# Patient Record
Sex: Male | Born: 2000 | Race: White | Hispanic: No | Marital: Single | Smoking: Never smoker
Health system: Southern US, Community
[De-identification: ages and names within clinical notes are randomized; demographics above are authoritative.]

---
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
## 2022-04-06 ENCOUNTER — Ambulatory Visit
Admission: RE | Admit: 2022-04-06 | Discharge: 2022-04-06 | Disposition: A | Discharge status: Home / Self Care | Payer: No Typology Code available for payment source | Source: Ambulatory Visit | Attending: Physician Assistant | Admitting: Physician Assistant

## 2022-04-06 ENCOUNTER — Other Ambulatory Visit: Payer: Self-pay | Admitting: Physician Assistant

## 2022-04-06 DIAGNOSIS — Y990 Civilian activity done for income or pay: Secondary | ICD-10-CM | POA: Insufficient documentation

## 2022-04-06 DIAGNOSIS — X58XXXA Exposure to other specified factors, initial encounter: Secondary | ICD-10-CM | POA: Insufficient documentation

## 2022-04-06 DIAGNOSIS — S6991XA Unspecified injury of right wrist, hand and finger(s), initial encounter: Secondary | ICD-10-CM | POA: Insufficient documentation

## 2022-04-15 ENCOUNTER — Other Ambulatory Visit: Payer: Self-pay

## 2022-04-15 ENCOUNTER — Encounter: Payer: Self-pay | Admitting: Emergency Medicine

## 2022-04-15 ENCOUNTER — Emergency Department
Admission: EM | Admit: 2022-04-15 | Discharge: 2022-04-15 | Disposition: A | Discharge status: Home / Self Care | Payer: Medicaid Other | Attending: Emergency Medicine | Admitting: Emergency Medicine

## 2022-04-15 DIAGNOSIS — S62646A Nondisplaced fracture of proximal phalanx of right little finger, initial encounter for closed fracture: Secondary | ICD-10-CM | POA: Diagnosis not present

## 2022-04-15 DIAGNOSIS — S62606A Fracture of unspecified phalanx of right little finger, initial encounter for closed fracture: Secondary | ICD-10-CM

## 2022-04-15 DIAGNOSIS — W231XXA Caught, crushed, jammed, or pinched between stationary objects, initial encounter: Secondary | ICD-10-CM | POA: Insufficient documentation

## 2022-04-15 DIAGNOSIS — S6991XA Unspecified injury of right wrist, hand and finger(s), initial encounter: Secondary | ICD-10-CM | POA: Diagnosis present

## 2022-04-15 NOTE — ED Triage Notes (Signed)
Pt here for a consult to orthopedics due to a finger injury that already has x-rays. Pt states finger got smashed Pt unable to go to PCP for referral as he does not currently have one.

## 2022-04-15 NOTE — ED Provider Notes (Signed)
[facility] Provider Note    Event Date/Time   First MD Initiated Contact with Patient 04/15/22 1159     (approximate)   History   Finger Injury   HPI  Lee Myers is a 22 y.o. male with history of fracture finger to the right little finger.  Patient has x-ray report and his mother states that they provided this at his father's workplace.  When they have decided not to do [employer] they no longer had a referral to orthopedics.  States that he needs to see a orthopedist for the finger fracture.  Did not finish the antibiotics he was given.  States he only took 2 days.  Tdap is up-to-date      Physical Exam   Triage Vital Signs: ED Triage Vitals  Enc Vitals Group     BP 04/15/22 1138 113/66     Pulse Rate 04/15/22 1138 85     Resp 04/15/22 1138 15     Temp 04/15/22 1138 97.7 F (36.5 C)     Temp src --      SpO2 04/15/22 1138 96 %     Weight 04/15/22 1139 120 lb (54.4 kg)     Height 04/15/22 1139 5\' 8"  (1.727 m)     Head Circumference --      Peak Flow --      Pain Score 04/15/22 1138 5     Pain Loc --      Pain Edu? --      Excl. in [place]? --     Most recent vital signs: Vitals:   04/15/22 1138  BP: 113/66  Pulse: 85  Resp: 15  Temp: 97.7 F (36.5 C)  SpO2: 96%     General: Awake, no distress.   CV:  Good peripheral perfusion. regular rate and  rhythm Resp:  Normal effort.  Abd:  No distention.   Other:  Right small finger abrasion/laceration noted distally, areas minimally tender, he does have good range of motion, neurovascular is intact   ED Results / Procedures / Treatments   Labs (all labs ordered are listed, but only abnormal results are displayed) Labs Reviewed - No data to display   EKG     RADIOLOGY     PROCEDURES:   Procedures   MEDICATIONS ORDERED IN ED: Medications - No data to display   IMPRESSION / MDM / [place] / ED COURSE  I reviewed the triage vital signs and the  nursing notes.                              Differential diagnosis includes, but is not limited to, fracture, contusion, cellulitis  Patient's presentation is most consistent with acute, uncomplicated illness.   The patient is basically here for referral to orthopedics.  He is to finish his antibiotics as previously prescribed.  Patient is to buddy tape the fingers.  Follow-up with Dr. Peggye Ley at [facility].  Return emergency department worsening.  They are in agreement treatment plan.  He was discharged stable condition.      FINAL CLINICAL IMPRESSION(S) / ED DIAGNOSES   Final diagnoses:  Closed nondisplaced fracture of phalanx of right little finger, unspecified phalanx, initial encounter     Rx / DC Orders   ED Discharge Orders     None        Note:  This document was prepared using Dragon voice recognition  software and may include unintentional dictation errors.    Versie Starks, PA-C 04/15/22 1514    Carrie Mew, MD 04/16/22 [phone]

## 2022-05-04 ENCOUNTER — Other Ambulatory Visit: Payer: Self-pay

## 2022-05-04 ENCOUNTER — Ambulatory Visit
Admission: EM | Admit: 2022-05-04 | Discharge: 2022-05-04 | Disposition: A | Discharge status: Home / Self Care | Payer: Medicaid Other | Attending: Family Medicine | Admitting: Family Medicine

## 2022-05-04 DIAGNOSIS — R300 Dysuria: Secondary | ICD-10-CM

## 2022-05-04 DIAGNOSIS — N3001 Acute cystitis with hematuria: Secondary | ICD-10-CM | POA: Diagnosis present

## 2022-05-04 LAB — URINALYSIS, W/ REFLEX TO CULTURE (INFECTION SUSPECTED)
Glucose, UA: NEGATIVE mg/dL
Ketones, ur: NEGATIVE mg/dL
Nitrite: NEGATIVE
Protein, ur: 30 mg/dL — AB
RBC / HPF: >50 RBC/hpf (ref 0–5)
Specific Gravity, Urine: >1.03 — ABNORMAL HIGH (ref 1.005–1.030)
pH: 5.5 (ref 5.0–8.0)

## 2022-05-04 MED ORDER — CEPHALEXIN 500 MG PO CAPS: 500.0000 mg | ORAL_CAPSULE | Freq: Four times a day (QID) | ORAL | 0 refills | Status: AC

## 2022-05-04 NOTE — Discharge Instructions (Addendum)
Stop by your pharmacy to pick up your antibiotics.  If someone contact you to take to stop taking your antibiotics you do not have a urinary tract infection and it is more likely that you have a kidney stone.  If you are still having pain or your pain continues to worsen please go to the emergency department for a scan of your belly to look for stones in your kidneys or your urinary tract.  Take 400 to 600 mg of ibuprofen every 6 hours as needed for pain.  Be sure to drink plenty of fluids.

## 2022-05-04 NOTE — ED Provider Notes (Signed)
[facility]    CSN: [id number] Arrival date & time: 05/04/22  [id number]      History   Chief Complaint Chief Complaint  Patient presents with   Dysuria    HPI  HPI  Lee Myers is a 22 y.o. male.   He had buring when finish urinating for the past 2 days. Last night, he saw clots in his urine. No history of kidney stones or urinary tract infections. He saw some mucus with the 2 clots.   Denies known STI exposure. Lee Myers reports he has not had intercourse in the past 1-1.5 years.    - Penile discharge no -Testicular pain no  - Fever: no - Abdominal pain : yes lower  - Rash: no - Sore throat: no   - Arthralgias: no - Nausea: no - Vomiting: no - Dysuria: yes  - Hematuria: yes  - Back Pain: yes  - Headache: no       History reviewed. No pertinent past medical history.  There are no problems to display for this patient.   History reviewed. No pertinent surgical history.     Home Medications    Prior to Admission medications   Medication Sig Start Date End Date Taking? Authorizing Provider  cephALEXin (KEFLEX) 500 MG capsule Take 1 capsule (500 mg total) by mouth 4 (four) times daily for 10 days. 05/04/22 05/14/22 Yes Lee Schnapp, DO  lidocaine (XYLOCAINE) 2 % solution Use as directed 15 mLs in the mouth or throat every 6 (six) hours as needed for mouth pain. 10/24/17   Loney Hering, MD    Family History History reviewed. No pertinent family history.  Social History Social History   Tobacco Use   Smoking status: Never    Passive exposure: Yes   Smokeless tobacco: Never  Vaping Use   Vaping Use: Every day  Substance Use Topics   Alcohol use: Never   Drug use: Yes    Types: Marijuana    Comment: last use 2 months     Allergies   Patient has no known allergies.   Review of Systems Review of Systems: negative unless otherwise stated in HPI.      Physical Exam Triage Vital Signs ED Triage Vitals  Enc Vitals Group      BP 05/04/22 1036 104/67     Pulse Rate 05/04/22 1036 (!) 110     Resp 05/04/22 1036 16     Temp 05/04/22 1036 98 F (36.7 C)     Temp src --      SpO2 05/04/22 1036 98 %     Weight 05/04/22 1037 120 lb (54.4 kg)     Height 05/04/22 1037 5' 7"$  (1.702 m)     Head Circumference --      Peak Flow --      Pain Score 05/04/22 1033 5     Pain Loc --      Pain Edu? --      Excl. in [place]? --    No data found.  Updated Vital Signs BP 104/67 (BP Location: Left Arm)   Pulse (!) 110   Temp 98 F (36.7 C)   Resp 16   Ht 5' 7"$  (1.702 m)   Wt 54.4 kg   SpO2 98%   BMI 18.79 kg/m   Visual Acuity Right Eye Distance:   Left Eye Distance:   Bilateral Distance:    Right Eye Near:   Left Eye Near:    Bilateral Near:  Physical Exam GEN: uncomfortable appearing male in no acute distress  CVS: well perfused, tachycardic, regular rhythm   RESP: speaking in full sentences without pause, no respiratory distress  ABD: soft, suprapubic tenderness, no CVA tenderness, no guarding, no rebound, no palpable masses  GU: deferred  SKIN: warm, dry   UC Treatments / Results  Labs (all labs ordered are listed, but only abnormal results are displayed) Labs Reviewed  URINALYSIS, W/ REFLEX TO CULTURE (INFECTION SUSPECTED) - Abnormal; Notable for the following components:      Result Value   APPearance HAZY (*)    Specific Gravity, Urine >1.030 (*)    Hgb urine dipstick MODERATE (*)    Bilirubin Urine SMALL (*)    Protein, ur 30 (*)    Leukocytes,Ua TRACE (*)    Bacteria, UA FEW (*)    All other components within normal limits  URINE CULTURE    EKG   Radiology No results found.  Procedures Procedures (including critical care time)  Medications Ordered in UC Medications - No data to display  Initial Impression / Assessment and Plan / UC Course  I have reviewed the triage vital signs and the nursing notes.  Pertinent labs & imaging results that were available during my care of  the patient were reviewed by me and considered in my medical decision making (see chart for details).     Pt is a 22 yo previously healthy male who presents for acute dysuria and hematuria. No history or recurrent UTIs or kidney stones. Chart review revealed history of bilateral epididymitis where he presented for testicular pain and treated with Bactrim at the [facility]. He denied testicular pain today.   On exam, he has suprapubic tenderness. UA collected and has some evidence of acute cystitis though few bacteria and trace leukocyte esterase. Considered kidney stones as he has back pain and significant hematuria on microscopy.  He appears uncomfortable. Discussed ED evaluation to rule out kidney stone but mom prefers to wait at this time. Will treat for acute cystitis with Keflex 500 mg QID for 10 days. Stressed hydration. ED precautions given and patient voiced understanding.    Final Clinical Impressions(s) / UC Diagnoses   Final diagnoses:  Dysuria  Acute cystitis with hematuria     Discharge Instructions      Stop by your pharmacy to pick up your antibiotics.  If someone contact you to take to stop taking your antibiotics you do not have a urinary tract infection and it is more likely that you have a kidney stone.  If you are still having pain or your pain continues to worsen please go to the emergency department for a scan of your belly to look for stones in your kidneys or your urinary tract.  Take 400 to 600 mg of ibuprofen every 6 hours as needed for pain.  Be sure to drink plenty of fluids.      ED Prescriptions     Medication Sig Dispense Auth. Provider   cephALEXin (KEFLEX) 500 MG capsule Take 1 capsule (500 mg total) by mouth 4 (four) times daily for 10 days. 40 capsule Lee Daher, DO      PDMP not reviewed this encounter.   Lee Hensen, DO 05/04/22 1303

## 2022-05-04 NOTE — ED Triage Notes (Addendum)
Pt with mucous in urine, burning at head of penis, and thinks he has blood urine. Sx for 2 days. Right flank pain

## 2022-05-05 LAB — URINE CULTURE: Culture: 60000 — AB

## 2022-05-06 LAB — URINE CULTURE

## 2023-07-04 ENCOUNTER — Ambulatory Visit
Admission: EM | Admit: 2023-07-04 | Discharge: 2023-07-04 | Disposition: A | Discharge status: Home / Self Care | Attending: Emergency Medicine | Admitting: Emergency Medicine

## 2023-07-04 ENCOUNTER — Encounter: Payer: Self-pay | Admitting: Emergency Medicine

## 2023-07-04 DIAGNOSIS — S91311A Laceration without foreign body, right foot, initial encounter: Secondary | ICD-10-CM

## 2023-07-04 DIAGNOSIS — Z23 Encounter for immunization: Secondary | ICD-10-CM

## 2023-07-04 MED ORDER — CIPROFLOXACIN HCL 750 MG PO TABS: 750.0000 mg | ORAL_TABLET | Freq: Two times a day (BID) | ORAL | 0 refills | Status: AC

## 2023-07-04 MED ORDER — IBUPROFEN 600 MG PO TABS: 600.0000 mg | ORAL_TABLET | Freq: Three times a day (TID) | ORAL | 0 refills | Status: AC | PRN

## 2023-07-04 MED ORDER — IBUPROFEN 800 MG PO TABS
800.0000 mg | ORAL_TABLET | Freq: Once | ORAL | Status: AC
  Administered 2023-07-04: 800 mg via ORAL

## 2023-07-04 MED ORDER — ACETAMINOPHEN 325 MG PO TABS
975.0000 mg | ORAL_TABLET | Freq: Once | ORAL | Status: AC
  Administered 2023-07-04: 975 mg via ORAL

## 2023-07-04 MED ORDER — LIDOCAINE-EPINEPHRINE-TETRACAINE (LET) TOPICAL GEL
3.0000 mL | Freq: Once | TOPICAL | Status: AC
  Administered 2023-07-04: 3 mL via TOPICAL

## 2023-07-04 MED ORDER — AMOXICILLIN-POT CLAVULANATE 875-125 MG PO TABS: 1.0000 | ORAL_TABLET | Freq: Two times a day (BID) | ORAL | 0 refills | Status: AC

## 2023-07-04 MED ORDER — TETANUS-DIPHTH-ACELL PERTUSSIS 5-2.5-18.5 LF-MCG/0.5 IM SUSY
0.5000 mL | PREFILLED_SYRINGE | Freq: Once | INTRAMUSCULAR | Status: AC
  Administered 2023-07-04: 0.5 mL via INTRAMUSCULAR

## 2023-07-04 NOTE — ED Provider Notes (Signed)
 HPI  SUBJECTIVE:  Lee Myers is a 23 y.o. male who presents with a laceration to the sole of his bare right foot sustained on a river rock in the river 3 hours PTA.  He reports pain and inability to walk because of the pain.  No foreign body sensation.  This time is.  No surrounding erythema, purulent drainage.  He applied hydrogen peroxide without improvement in his symptoms.  Symptoms worse when he moves first toe.  Of note, he works at [employer] loading trucks.  He has no past medical history.  His tetanus is not up-to-date.  All other immunizations are up-to-date.  Additional history obtained from mother.   History reviewed. No pertinent past medical history.  History reviewed. No pertinent surgical history.  History reviewed. No pertinent family history.  Social History   Tobacco Use   Smoking status: Never    Passive exposure: Yes   Smokeless tobacco: Never  Vaping Use   Vaping status: Every Day  Substance Use Topics   Alcohol use: Never   Drug use: Yes    Types: Marijuana    Comment: last use 2 months     Current Facility-Administered Medications:    lidocaine -EPINEPHrine -tetracaine  (LET) topical gel, 3 mL, Topical, Once, Ethlyn Herd, MD  Current Outpatient Medications:    amoxicillin -clavulanate (AUGMENTIN ) 875-125 MG tablet, Take 1 tablet by mouth every 12 (twelve) hours., Disp: 14 tablet, Rfl: 0   ciprofloxacin  (CIPRO ) 750 MG tablet, Take 1 tablet (750 mg total) by mouth 2 (two) times daily for 7 days., Disp: 14 tablet, Rfl: 0   ibuprofen  (ADVIL ) 600 MG tablet, Take 1 tablet (600 mg total) by mouth every 8 (eight) hours as needed., Disp: 30 tablet, Rfl: 0   lidocaine  (XYLOCAINE ) 2 % solution, Use as directed 15 mLs in the mouth or throat every 6 (six) hours as needed for mouth pain., Disp: 100 mL, Rfl: 0  No Known Allergies   ROS  As noted in HPI.   Physical Exam  BP 114/70 (BP Location: Left Arm)   Pulse 75   Temp 98.9 F (37.2 C) (Oral)   Resp 16    SpO2 98%   Constitutional: Well developed, well nourished, no acute distress Eyes:  EOMI, conjunctiva normal bilaterally HENT: Normocephalic, atraumatic,mucus membranes moist Respiratory: Normal inspiratory effort Cardiovascular: Normal rate GI: nondistended skin: see msk exam Musculoskeletal: 2 cm linear laceration ball of right foot extending down to the dermis/fat with some debris.  No evidence of neurovascular or tendon involvement noted.    Neurologic: Alert & oriented x 3, no focal neuro deficits Psychiatric: Speech and behavior appropriate   ED Course   Medications  lidocaine -EPINEPHrine -tetracaine  (LET) topical gel (has no administration in time range)  Tdap (BOOSTRIX) injection 0.5 mL (0.5 mLs Intramuscular Given 07/04/23 1943)    No orders of the defined types were placed in this encounter.   No results found for this or any previous visit (from the past 24 hours). No results found.  ED Clinical Impression  1. Laceration of right foot, initial encounter      ED Assessment/Plan     Updating tetanus.  Applied let gel.  Will let this anesthetize before thoroughly scrubbing this out with tap water chlorhexidine soap.  No evidence of neurovascular involvement noted.  Do not think that he has retained foreign body, deferring x-ray today.  Will not suture as I do not want to close in an infection.  Will let this heal by secondary intention.  Scrubbed wound out until it was clear of debris with chlorhexidine soap and water.  Rinse with water.  Placed dressing.  Patient tolerated procedure well.  Given Tylenol /ibuprofen  for pain.  Home with Augmentin  875 mg p.o. twice daily and Cipro  750 mg twice daily for 7 days to cover Pseudomonas given that this was sustained in the river per up-to-date recommendations.  Tylenol /ibuprofen .  Sending patient home with chlorhexidine soap-she is to wash this daily.  Work note for 3 days as he works for [employer] and spends a lot of time  on his foot.  Follow-up with PCP as needed.  ER return precautions given the patient and parent.  Discussed  MDM, treatment plan, and plan for follow-up with parent. Discussed sn/sx that should prompt return to the ED. parent agrees with plan.   Meds ordered this encounter  Medications   Tdap (BOOSTRIX) injection 0.5 mL   lidocaine -EPINEPHrine -tetracaine  (LET) topical gel   amoxicillin -clavulanate (AUGMENTIN ) 875-125 MG tablet    Sig: Take 1 tablet by mouth every 12 (twelve) hours.    Dispense:  14 tablet    Refill:  0   ciprofloxacin  (CIPRO ) 750 MG tablet    Sig: Take 1 tablet (750 mg total) by mouth 2 (two) times daily for 7 days.    Dispense:  14 tablet    Refill:  0   ibuprofen  (ADVIL ) 600 MG tablet    Sig: Take 1 tablet (600 mg total) by mouth every 8 (eight) hours as needed.    Dispense:  30 tablet    Refill:  0      *This clinic note was created using Scientist, clinical (histocompatibility and immunogenetics). Therefore, there may be occasional mistakes despite careful proofreading.  ?    Ethlyn Herd, MD 07/05/23 [id number]

## 2023-07-04 NOTE — ED Triage Notes (Signed)
 Patient declines tdap  Patient was in river fishing and cut his foot on something. Right foot.

## 2023-07-04 NOTE — Discharge Instructions (Addendum)
 This will heal in 7 to 10 days.  Keep this clean with chlorhexidine soap and water.  Make sure you wash it out daily.  Finish the antibiotics.  We updated your tetanus today.  May take 600 mg of ibuprofen , 1000 mg of Tylenol  3-4 times a day as needed for pain.  Go to the ER for fevers above 100.4, redness, swelling around the laceration, or for other concerns.
